# Patient Record
Sex: Female | Born: 1935 | Race: White | Hispanic: No | Marital: Married | Smoking: Former smoker
Health system: Southern US, Community
[De-identification: ages and names within clinical notes are randomized; demographics above are authoritative.]

---

## 2016-01-30 ENCOUNTER — Ambulatory Visit (INDEPENDENT_AMBULATORY_CARE_PROVIDER_SITE_OTHER): Payer: Medicare Other | Admitting: Internal Medicine

## 2016-01-30 ENCOUNTER — Telehealth: Payer: Self-pay | Admitting: Internal Medicine

## 2016-01-30 ENCOUNTER — Encounter: Payer: Self-pay | Admitting: Internal Medicine

## 2016-01-30 ENCOUNTER — Ambulatory Visit (INDEPENDENT_AMBULATORY_CARE_PROVIDER_SITE_OTHER)
Admission: RE | Admit: 2016-01-30 | Discharge: 2016-01-30 | Disposition: A | Discharge status: Home / Self Care | Payer: Medicare Other | Source: Ambulatory Visit | Attending: Internal Medicine | Admitting: Internal Medicine

## 2016-01-30 VITALS — BP 130/80 | HR 80 | Ht 65.0 in | Wt 113.0 lb

## 2016-01-30 DIAGNOSIS — J479 Bronchiectasis, uncomplicated: Secondary | ICD-10-CM

## 2016-01-30 DIAGNOSIS — R05 Cough: Secondary | ICD-10-CM | POA: Insufficient documentation

## 2016-01-30 DIAGNOSIS — E039 Hypothyroidism, unspecified: Secondary | ICD-10-CM | POA: Diagnosis not present

## 2016-01-30 DIAGNOSIS — R059 Cough, unspecified: Secondary | ICD-10-CM

## 2016-01-30 NOTE — Patient Instructions (Signed)
   Add pepcid otc 20 mg one hour before bedtime until return here   GERD (REFLUX)  is an extremely common cause of respiratory symptoms just like yours , many times with no obvious heartburn at all.    It can be treated with medication, but also with lifestyle changes including elevation of the head of your bed (ideally with 6 inch  bed blocks),  Smoking cessation, avoidance of late meals, excessive alcohol, and avoid fatty foods, chocolate, peppermint, colas, red wine, and acidic juices such as orange juice.  NO MINT OR MENTHOL PRODUCTS SO NO COUGH DROPS   USE SUGARLESS CANDY INSTEAD (Jolley ranchers or Stover's or Life Savers) or even ice chips will also do - the key is to swallow to prevent all throat clearing. NO OIL BASED VITAMINS - use powdered substitutes.  Please remember to go to the lab and x-ray department downstairs for your tests - we will call you with the results when they are available and after I have a chance to review your records from [place] and [place]   Please schedule a follow up office visit in 4 weeks, sooner if needed

## 2016-01-30 NOTE — Progress Notes (Signed)
Subjective:     Patient ID: Martha Olson, female   DOB: [birth year]       MRN: [id number]  HPI  58 yowf from [place] by Dr Kennon Rounds in [place]  quit smoking 04/1958  Bad cough in [place] around 2011 eval by [place] doctor who treated x one week  and it resolved then recurred summer 2017 and seen by pulmonary and ID in [place] with no response to levaquin or IV abx so referred to pulmonary clinic 01/30/2016 by Dr   Hillard Danker for second opinion   01/30/2016 1st [facility] office visit/ [name]   Chief Complaint  Patient presents with  . Pulmonary Consult    Referred by Dr. Alice Reichert. Pt c/o "bacteria of the lung" for the past 3 months. She c/o fatigue. She also c/o non prod cough.   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 No unusual exp hx or h/o childhood pna/ asthma or knowledge of premature birth.  Sleeping ok without nocturnal  or early am exacerbation  of respiratory  c/o's or need for noct saba. Also denies any obvious fluctuation of symptoms with weather or environmental changes or other aggravating or alleviating factors except as outlined above   Current Medications, Allergies, Complete Past Medical History, Past Surgical History, Family History, and Social History were reviewed in [facility] record.  ROS  The following are not active complaints unless bolded sore throat, dysphagia, dental problems, itching, sneezing,  nasal congestion or excess/ purulent secretions, ear ache,   fever, chills, sweats, unintended wt loss, classically pleuritic or exertional cp,  orthopnea pnd or  leg swelling, presyncope, palpitations, abdominal pain, anorexia, nausea, vomiting, diarrhea  or change in bowel or bladder habits, change in stools or urine, dysuria,hematuria,  rash, arthralgias, visual complaints, headache, numbness, weakness or ataxia or problems with walking or coordination,  change in mood/affect or memory.                   Review of Systems     Objective:   Physical Exam    amb wf nad but   failed to answer a single question asked in a straightforward manner, tending to go off on tangents or answer questions with ambiguous medical terms or diagnoses (I saw a disease specialist from [place] who know what this was ? what was it "he never told me") and seemed perplexed  when asked the same question more than once for clarification.    Wt Readings from Last 3 Encounters:  01/30/16 113 lb (51.3 kg)    Vital signs reviewed - Note on arrival 02 sats  99% on RA    HEENT: nl   turbinates, and oropharynx. Nl external ear canals without cough reflex   NECK :  without JVD/Nodes/TM/ nl carotid upstrokes bilaterally   LUNGS: no acc muscle use,  Nl contour chest which is clear to A and P bilaterally without cough on insp or exp maneuvers   CV:  RRR  no s3 or murmur or increase in P2, no edema   ABD:  soft and nontender with nl inspiratory excursion in the supine position. No bruits or organomegaly, bowel sounds nl  MS:  Nl gait/ ext warm without deformities, calf tenderness, cyanosis or clubbing No obvious joint restrictions   SKIN: warm and dry without lesions    NEURO:  alert, approp, nl sensorium with  no motor deficits     CXR PA and Lateral:   01/30/2016 :    I personally reviewed images and agree with radiology impression as follows:    Chronic bronchitic and fibrotic changes. Areas of subtle nodularity bilaterally. Given these findings and the lack of previous studies for comparison, chest CT scanning is recommended.   Labs ordered 01/30/2016    Included esr/ tsh > apparently did not go to lab as requested       Assessment:

## 2016-01-30 NOTE — Telephone Encounter (Signed)
LMTCB

## 2016-01-31 DIAGNOSIS — J479 Bronchiectasis, uncomplicated: Secondary | ICD-10-CM | POA: Insufficient documentation

## 2016-01-31 NOTE — Assessment & Plan Note (Signed)
This is an extremely common benign condition in the elderly and does not warrant aggressive eval/ rx at this point unless there is a clinical correlation suggesting unaddressed pulmonary infection (purulent sputum, night sweats, unintended wt loss, doe) or evolution of  obvious changes on plain cxr (as opposed to serial CT, which is way over sensitive to make clinical decisions re intervention and treatment in the elderly, who tend to tolerate both dx and treatment poorly) .  

## 2016-01-31 NOTE — Progress Notes (Signed)
lmtcb

## 2016-01-31 NOTE — Assessment & Plan Note (Signed)
rec recheck tsh due to fatigue but did not go to lab

## 2016-01-31 NOTE — Telephone Encounter (Signed)
Attempted to call pt. Received a fast busy signal x2. Will try back. 

## 2016-01-31 NOTE — Telephone Encounter (Signed)
Pt retuning call again.Martha GriffinsStanley A Dalton

## 2016-01-31 NOTE — Telephone Encounter (Signed)
Pt returning call and can be reached @ [phone].[name]

## 2016-01-31 NOTE — Telephone Encounter (Signed)
Spoke with pt. She wanted to let us know that we will have to get her records from a hospital in [place]. Advised her that she would need to come here and sign a new records release for this. She agreed and verbalized understanding. Nothing further was needed.

## 2016-01-31 NOTE — Assessment & Plan Note (Addendum)
Prev w/u in [place] in 2011 and [place] summer of 2017 by pulmonary > records request  The most common causes of chronic cough in immunocompetent adults include the following: upper airway cough syndrome (UACS), previously referred to as postnasal drip syndrome (PNDS), which is caused by variety of rhinosinus conditions; (2) asthma; (3) GERD; (4) chronic bronchitis from cigarette smoking or other inhaled environmental irritants; (5) nonasthmatic eosinophilic bronchitis; and (6) bronchiectasis.   These conditions, singly or in combination, have accounted for up to 94% of the causes of chronic cough in prospective studies.   Other conditions have constituted no >6% of the causes in prospective studies These have included bronchogenic carcinoma, chronic interstitial pneumonia, sarcoidosis, left ventricular failure, ACEI-induced cough, and aspiration from a condition associated with pharyngeal dysfunction.    Chronic cough is often simultaneously caused by more than one condition. A single cause has been found from 38 to 82% of the time, multiple causes from 18 to 62%. Multiply caused cough has been the result of three diseases up to 42% of the time.       Most likely she has bronchiectasis/ MAI (see sep a/p)with recent flare of purulent sputum that has resolved and only remeaining issues is noct cough that is reflux related until proven otherwise so rec noct h2 and GERD diet  await review of studies.   Total time devoted to counseling  = 35/7173m review case with pt/ discussion of options/alternatives/ personally creating written instructions  in presence of pt  then going over those specific  Instructions directly with the pt including how to use all of the meds but in particular covering each new medication in detail and the difference between the maintenance/automatic meds and the prns using an action plan format for the latter.

## 2016-01-31 NOTE — Telephone Encounter (Signed)
LMTCB

## 2016-02-29 ENCOUNTER — Ambulatory Visit: Payer: Medicare Other | Admitting: Internal Medicine

## 2017-03-01 IMAGING — DX DG CHEST 2V
2 series · 2 of 2 positions shown · non-contrast
Comparison: None in PACs

CLINICAL DATA: Cough, lung infection for 3 months. History of
hypertension, former smoker.

EXAM:
CHEST  2 VIEW

[chest pa]
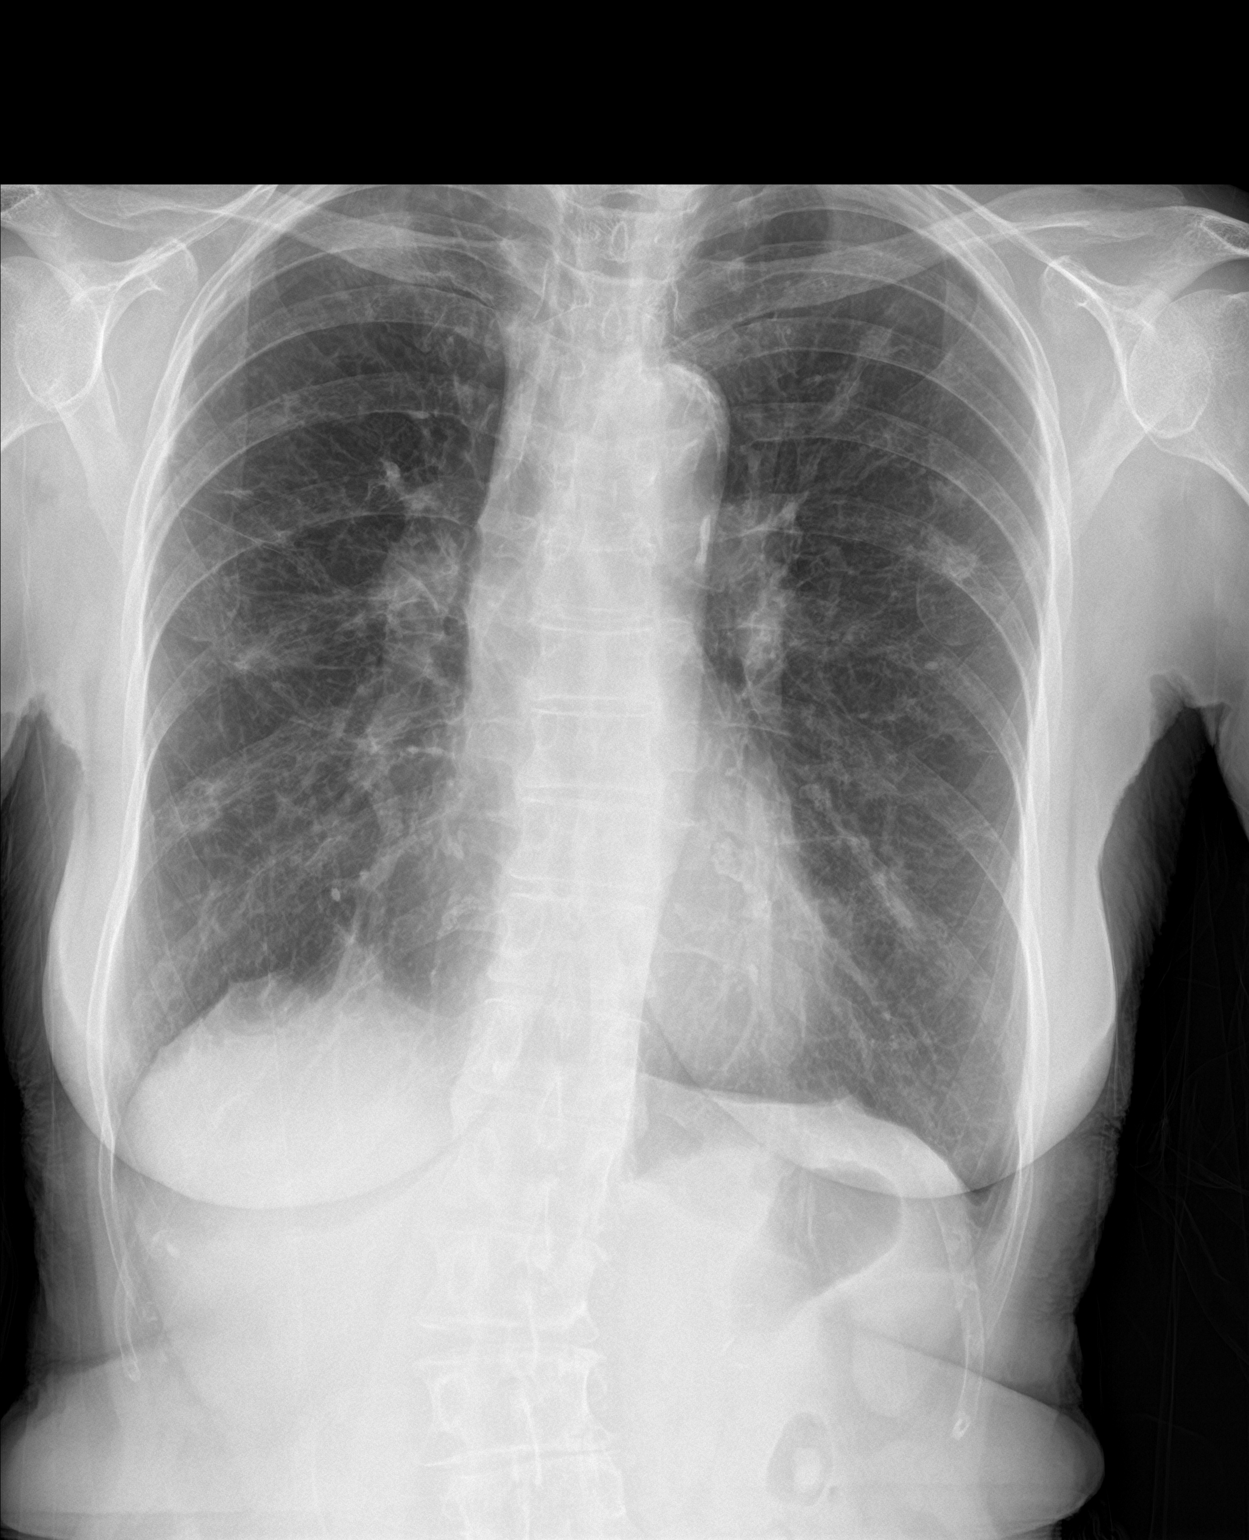

[chest lat]
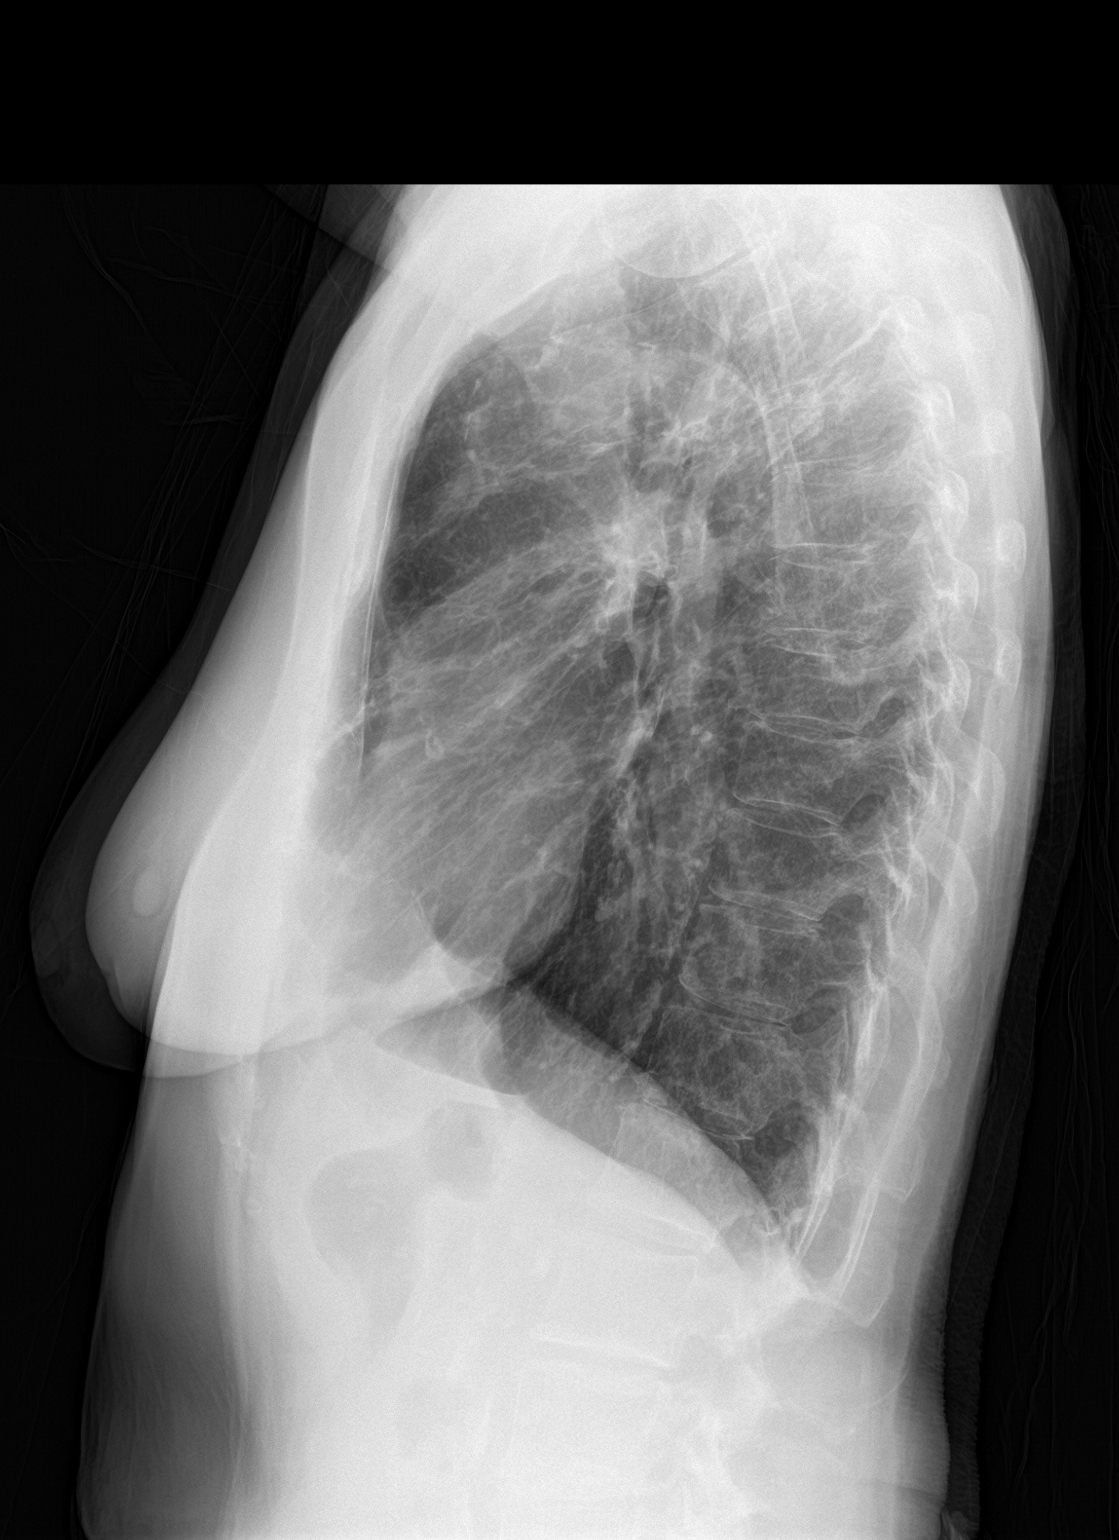

[2 of 2 positions shown; findings below may reference images not displayed]

FINDINGS: The lungs are hyperinflated with hemidiaphragm flattening. The
interstitial markings are coarse bilaterally. Areas of subtle
nodularity projects in both lungs. The heart and pulmonary
vascularity are normal. There is calcification in the wall of the
thoracic aorta. There is no significant pleural effusion. The bony
thorax exhibits no acute abnormality.
IMPRESSION: Chronic bronchitic and fibrotic changes. Areas of subtle nodularity
bilaterally. Given these findings and the lack of previous studies
for comparison, chest CT scanning is recommended.

Aortic atherosclerosis.

## 2022-07-01 DEATH — deceased
